# Patient Record
Sex: Male | Born: 1980 | Hispanic: Yes | Marital: Married | Smoking: Current every day smoker
Health system: Southern US, Community
[De-identification: ages and names within clinical notes are randomized; demographics above are authoritative.]

---

## 2019-07-23 ENCOUNTER — Emergency Department (HOSPITAL_COMMUNITY)
Admission: EM | Admit: 2019-07-23 | Discharge: 2019-07-24 | Disposition: A | Discharge status: Home / Self Care | Payer: Self-pay | Attending: Emergency Medicine | Admitting: Emergency Medicine

## 2019-07-23 ENCOUNTER — Emergency Department (HOSPITAL_COMMUNITY): Payer: Self-pay

## 2019-07-23 ENCOUNTER — Encounter (HOSPITAL_COMMUNITY): Payer: Self-pay | Admitting: Emergency Medicine

## 2019-07-23 ENCOUNTER — Other Ambulatory Visit: Payer: Self-pay

## 2019-07-23 DIAGNOSIS — Y999 Unspecified external cause status: Secondary | ICD-10-CM | POA: Insufficient documentation

## 2019-07-23 DIAGNOSIS — Y929 Unspecified place or not applicable: Secondary | ICD-10-CM | POA: Insufficient documentation

## 2019-07-23 DIAGNOSIS — W311XXA Contact with metalworking machines, initial encounter: Secondary | ICD-10-CM | POA: Insufficient documentation

## 2019-07-23 DIAGNOSIS — S81811A Laceration without foreign body, right lower leg, initial encounter: Secondary | ICD-10-CM | POA: Insufficient documentation

## 2019-07-23 DIAGNOSIS — Y9389 Activity, other specified: Secondary | ICD-10-CM | POA: Insufficient documentation

## 2019-07-23 DIAGNOSIS — Z23 Encounter for immunization: Secondary | ICD-10-CM | POA: Insufficient documentation

## 2019-07-23 DIAGNOSIS — F17210 Nicotine dependence, cigarettes, uncomplicated: Secondary | ICD-10-CM | POA: Insufficient documentation

## 2019-07-23 MED ORDER — OXYCODONE-ACETAMINOPHEN 5-325 MG PO TABS
1.0000 | ORAL_TABLET | Freq: Once | ORAL | Status: AC
  Administered 2019-07-23: 22:00:00 1 via ORAL
  Filled 2019-07-23: qty 1

## 2019-07-23 NOTE — ED Triage Notes (Signed)
Patient accidentally hit his right upper shin with a metal grinder this evening , presents with laceration approx. 2" with mild bleeding at right upper shin , sterile gauze dressing applied at triage .

## 2019-07-24 MED ORDER — HYDROCODONE-ACETAMINOPHEN 5-325 MG PO TABS: 1.0000 | ORAL_TABLET | Freq: Four times a day (QID) | ORAL | 0 refills | Status: AC | PRN

## 2019-07-24 MED ORDER — TETANUS-DIPHTH-ACELL PERTUSSIS 5-2.5-18.5 LF-MCG/0.5 IM SUSP
0.5000 mL | Freq: Once | INTRAMUSCULAR | Status: AC
  Administered 2019-07-24: 01:00:00 0.5 mL via INTRAMUSCULAR
  Filled 2019-07-24: qty 0.5

## 2019-07-24 MED ORDER — LIDOCAINE-EPINEPHRINE (PF) 2 %-1:200000 IJ SOLN
10.0000 mL | Freq: Once | INTRAMUSCULAR | Status: AC
  Administered 2019-07-24: 10 mL
  Filled 2019-07-24: qty 20

## 2019-07-24 MED ORDER — BUPIVACAINE HCL (PF) 0.5 % IJ SOLN
10.0000 mL | Freq: Once | INTRAMUSCULAR | Status: AC
  Administered 2019-07-24: 10 mL
  Filled 2019-07-24: qty 10

## 2019-07-24 MED ORDER — DOXYCYCLINE HYCLATE 100 MG PO CAPS: 100.0000 mg | ORAL_CAPSULE | Freq: Two times a day (BID) | ORAL | 0 refills | Status: AC

## 2019-07-24 MED ORDER — IBUPROFEN 600 MG PO TABS: 600.0000 mg | ORAL_TABLET | Freq: Four times a day (QID) | ORAL | 0 refills | Status: AC | PRN

## 2019-07-24 NOTE — Discharge Instructions (Addendum)
°  Wound Care - Laceration Please take all of your antibiotics until finished!   You may develop abdominal discomfort or diarrhea from the antibiotic.  You may help offset this with probiotics which you can buy or get in yogurt. Do not eat or take the probiotics until 2 hours after your antibiotic.   Leave the bandage in place until you are seen by the orthopedic specialist.  When the orthopedic specialist says it is okay, clean the wound and surrounding area gently with tap water and mild soap. Rinse well and blot dry. Do not scrub the wound, as this may cause the wound edges to come apart. You may shower, but avoid submerging the wound, such as with a bath or swimming. Clean the wound daily to prevent infection. Do not use cleaners such as hydrogen peroxide or alcohol.   Scar reduction: Application of a topical antibiotic ointment, such as Neosporin, after the wound has begun to close and heal well can decrease scab formation and reduce scarring. After the wound has healed and wound closures have been removed, application of ointments such as Aquaphor can also reduce scar formation.  The key to scar reduction is keeping the skin well hydrated and supple. Drinking plenty of water throughout the day (At least eight 8oz glasses of water a day) is essential to staying well hydrated.  Sun exposure: Keep the wound out of the sun. After the wound has healed, continue to protect it from the sun by wearing protective clothing or applying sunscreen.  Pain: You may use Tylenol, naproxen, or ibuprofen for pain. Antiinflammatory medications: Take 600 mg of ibuprofen every 6 hours or 440 mg (over the counter dose) to 500 mg (prescription dose) of naproxen every 12 hours for the next 3 days. After this time, these medications may be used as needed for pain. Take these medications with food to avoid upset stomach. Choose only one of these medications, do not take them together. Acetaminophen (generic for Tylenol):  Should you continue to have additional pain while taking the ibuprofen or naproxen, you may add in acetaminophen as needed. Your daily total maximum amount of acetaminophen from all sources should be limited to 4000mg /day for persons without liver problems, or 2000mg /day for those with liver problems.  Vicodin: May take Vicodin (hydrocodone-acetaminophen) as needed for severe pain.   Do not drive or perform other dangerous activities while taking this medication as it can cause drowsiness as well as changes in reaction time and judgement.   Please note that each pill of Vicodin contains 325 mg of acetaminophen (generic for Tylenol) and the above dosage limits apply.  Follow-up: There was some mild appearing damage to the bone.  Follow-up with the orthopedic (bone) specialist Friday, October 16.  Call later today to make this appointment.  Suture/staple removal: Return to the ED in 8-12 days for suture removal.  Return to the ED sooner should the wound edges come apart or signs of infection arise, such as spreading redness, puffiness/swelling, pus draining from the wound, severe increase in pain, fever over 100.2F, or any other major issues.  For prescription assistance, may try using prescription discount sites or apps, such as goodrx.com

## 2019-07-24 NOTE — ED Notes (Signed)
Pt discharged from ED; instructions provided and scripts given; Pt encouraged to return to ED if symptoms worsen and to f/u with PCP; Pt verbalized understanding of all instructions 

## 2019-07-24 NOTE — ED Provider Notes (Signed)
[facility] EMERGENCY DEPARTMENT Provider Note   CSN: [id number] Arrival date & time: 07/23/19  2151     History   Chief Complaint Chief Complaint  Patient presents with   Leg Injury    Laceration     HPI Jonathan Horn is a 38 y.o. male.     HPI  Jonathan Horn is a 38 y.o. male, patient with no pertinent past medical history, presenting to the ED with right lower leg injury that occurred around 9 PM October 13. Patient was using a grinder to grind metal, slipped, and cut his right shin. Unknown last tetanus.  Pain is moderate, throbbing, nonradiating.  Denies numbness, weakness, other injuries.    History reviewed. No pertinent past medical history.  There are no active problems to display for this patient.   History reviewed. No pertinent surgical history.      Home Medications    Prior to Admission medications   Medication Sig Start Date End Date Taking? Authorizing Provider  doxycycline (VIBRAMYCIN) 100 MG capsule Take 1 capsule (100 mg total) by mouth 2 (two) times daily for 7 days. 07/24/19 07/31/19  Kaydance Bowie C, PA-C  HYDROcodone-acetaminophen (NORCO/VICODIN) 5-325 MG tablet Take 1-2 tablets by mouth every 6 (six) hours as needed for severe pain. 07/24/19   Shandria Clinch C, PA-C  ibuprofen (ADVIL) 600 MG tablet Take 1 tablet (600 mg total) by mouth every 6 (six) hours as needed. 07/24/19   Minetta Krisher, Hillard Danker, PA-C    Family History No family history on file.  Social History Social History   Tobacco Use   Smoking status: Current Every Day Smoker   Smokeless tobacco: Never Used  Substance Use Topics   Alcohol use: Yes   Drug use: Never     Allergies   Patient has no known allergies.   Review of Systems Review of Systems  Skin: Positive for wound.  Neurological: Negative for weakness and numbness.     Physical Exam Updated Vital Signs BP 127/79 (BP Location: Right Arm)    Pulse 66    Temp 97.7 F (36.5 C) (Oral)     Resp 16   Physical Exam Vitals signs and nursing note reviewed.  Constitutional:      General: He is not in acute distress.    Appearance: He is well-developed. He is not diaphoretic.  HENT:     Head: Normocephalic and atraumatic.  Eyes:     Conjunctiva/sclera: Conjunctivae normal.  Neck:     Musculoskeletal: Neck supple.  Cardiovascular:     Rate and Rhythm: Normal rate and regular rhythm.     Pulses:          Dorsalis pedis pulses are 2+ on the right side and 2+ on the left side.       Posterior tibial pulses are 2+ on the right side and 2+ on the left side.  Pulmonary:     Effort: Pulmonary effort is normal.  Musculoskeletal:     Comments: Approximately 4 cm slightly crescent-shaped laceration to the right anterior lower leg. Full range of motion intact in the right knee and ankle without noted difficulty.  Skin:    General: Skin is warm and dry.     Coloration: Skin is not pale.  Neurological:     Mental Status: He is alert.     Comments: Sensation to light touch grossly intact in the right lower extremity. Strength 5/5 with flexion and extension at the right knee and ankle. Limping  gait, but stable and does not require assistance.  Psychiatric:        Behavior: Behavior normal.          ED Treatments / Results  Labs (all labs ordered are listed, but only abnormal results are displayed) Labs Reviewed - No data to display  EKG None  Radiology Dg Tibia/fibula Right  Result Date: 07/23/2019 CLINICAL DATA:  Struck upper shin with metal grinder EXAM: RIGHT TIBIA AND FIBULA - 2 VIEW COMPARISON:  None. FINDINGS: Anterior swelling is noted about the proximal lower leg. There is soft tissue laceration, gas and few punctate radiodensities in the anterior soft tissues with a small cortical defect subjacent to the site of laceration. No fracture or traumatic malalignment is evident. Overlying bandaging material is present. IMPRESSION: Soft tissue laceration about the  proximal lower leg with a few punctate radiodensities in the anterior soft tissues which could reflect punctate bone fragments given cortical defect or radiodense debris. Electronically Signed   By: Kreg ShropshirePrice  DeHay M.D.   On: 07/23/2019 22:38    Procedures .Marland Kitchen.Laceration Repair  Date/Time: 07/24/2019 1:43 AM Performed by: Anselm PancoastJoy, Linton Stolp C, PA-C Authorized by: Anselm PancoastJoy, Anihya Tuma C, PA-C   Consent:    Consent obtained:  Verbal   Consent given by:  Patient   Risks discussed:  Infection, pain, retained foreign body, vascular damage, poor wound healing, nerve damage, need for additional repair and poor cosmetic result Anesthesia (see MAR for exact dosages):    Anesthesia method:  Local infiltration   Local anesthetic:  Lidocaine 2% WITH epi and bupivacaine 0.5% w/o epi Laceration details:    Location:  Leg   Leg location:  R lower leg   Length (cm):  4 Repair type:    Repair type:  Intermediate Exploration:    Wound exploration: wound explored through full range of motion   Treatment:    Area cleansed with:  Betadine and saline   Amount of cleaning:  Extensive   Irrigation solution:  Sterile saline   Irrigation volume:  1000 cc   Irrigation method:  Syringe Skin repair:    Repair method:  Sutures   Suture size:  3-0   Suture material:  Prolene   Suture technique:  Horizontal mattress   Number of sutures:  2 Approximation:    Approximation:  Loose Post-procedure details:    Dressing:  Non-adherent dressing, sterile dressing, bulky dressing and splint for protection   Patient tolerance of procedure:  Tolerated well, no immediate complications   (including critical care time)  Medications Ordered in ED Medications  oxyCODONE-acetaminophen (PERCOCET/ROXICET) 5-325 MG per tablet 1 tablet (1 tablet Oral Given 07/23/19 2216)  bupivacaine (MARCAINE) 0.5 % injection 10 mL (10 mLs Infiltration Given by Other 07/24/19 0101)  Tdap (BOOSTRIX) injection 0.5 mL (0.5 mLs Intramuscular Given 07/24/19 0101)   lidocaine-EPINEPHrine (XYLOCAINE W/EPI) 2 %-1:200000 (PF) injection 10 mL (10 mLs Infiltration Given by Other 07/24/19 0101)     Initial Impression / Assessment and Plan / ED Course  I have reviewed the triage vital signs and the nursing notes.  Pertinent labs & imaging results that were available during my care of the patient were reviewed by me and considered in my medical decision making (see chart for details).  Clinical Course as of Jul 23 442  Wed Jul 24, 2019  0256 Spoke with Dr. August Saucerean, orthopedic surgeon.  Recommends loose closure with two sutures. Weightbearing with knee immobilizer. Large volume irrigation.Start on antibiotic. Follow-up in office Friday, October 16.   [  SJ]    Clinical Course User Index [SJ] Swain Acree C, PA-C       Patient presents with laceration to the right lower leg.  On x-ray, there appears to be some injury to the tibia.  No evidence of neurovascular compromise on exam. Wound loosely repaired without immediate complication.  Orthopedic follow-up. The patient was given instructions for home care as well as return precautions. Patient voices understanding of these instructions, accepts the plan, and is comfortable with discharge.  Final Clinical Impressions(s) / ED Diagnoses   Final diagnoses:  Laceration of right lower extremity, initial encounter    ED Discharge Orders         Ordered    doxycycline (VIBRAMYCIN) 100 MG capsule  2 times daily     07/24/19 0251    ibuprofen (ADVIL) 600 MG tablet  Every 6 hours PRN     07/24/19 0251    HYDROcodone-acetaminophen (NORCO/VICODIN) 5-325 MG tablet  Every 6 hours PRN     07/24/19 0251           Lorayne Bender, PA-C 07/24/19 0444    Orpah Greek, MD 07/24/19 [id number]

## 2019-07-24 NOTE — Progress Notes (Signed)
Grinder wheel injury to right tibia X-rays reviewed.  Bone fragments and possible ceramic fragments embedded in the tissue. Plan at this time is 2 L irrigation plus loose closure to allow this to drain.  Knee immobilizer and follow-up in clinic on Monday.  2 days of oral antibiotics indicated.  He may need operative surgical washout next week but for now we will start with this.

## 2020-02-06 ENCOUNTER — Encounter (HOSPITAL_COMMUNITY): Payer: Self-pay | Admitting: Emergency Medicine

## 2020-02-06 ENCOUNTER — Other Ambulatory Visit: Payer: Self-pay

## 2020-02-06 ENCOUNTER — Emergency Department (HOSPITAL_COMMUNITY)
Admission: EM | Admit: 2020-02-06 | Discharge: 2020-02-06 | Disposition: A | Discharge status: Home / Self Care | Payer: Self-pay | Attending: Emergency Medicine | Admitting: Emergency Medicine

## 2020-02-06 ENCOUNTER — Emergency Department (HOSPITAL_COMMUNITY): Payer: Self-pay

## 2020-02-06 DIAGNOSIS — F17210 Nicotine dependence, cigarettes, uncomplicated: Secondary | ICD-10-CM | POA: Insufficient documentation

## 2020-02-06 DIAGNOSIS — J302 Other seasonal allergic rhinitis: Secondary | ICD-10-CM | POA: Insufficient documentation

## 2020-02-06 MED ORDER — CETIRIZINE HCL 10 MG PO TABS: 10.0000 mg | ORAL_TABLET | Freq: Every day | ORAL | 0 refills | Status: AC

## 2020-02-06 MED ORDER — CROMOLYN SODIUM 4 % OP SOLN: 1.0000 [drp] | Freq: Four times a day (QID) | OPHTHALMIC | 0 refills | Status: AC

## 2020-02-06 MED ORDER — ALBUTEROL SULFATE HFA 108 (90 BASE) MCG/ACT IN AERS
1.0000 | INHALATION_SPRAY | Freq: Once | RESPIRATORY_TRACT | Status: AC
  Administered 2020-02-06: 13:00:00 2 via RESPIRATORY_TRACT
  Filled 2020-02-06: qty 6.7

## 2020-02-06 MED ORDER — BENZONATATE 100 MG PO CAPS: 100.0000 mg | ORAL_CAPSULE | Freq: Three times a day (TID) | ORAL | 0 refills | Status: AC | PRN

## 2020-02-06 NOTE — Discharge Instructions (Signed)
Lo han visto hoy por alergias. Lea y siga todas las instrucciones proporcionadas. Regrese a la sala de emergencias si su condicin empeora o si presenta nuevos sntomas preocupantes. 1. Medicamentos: Se ha enviado una receta a su farmacia para 3 medicamentos. 1. Gotas para los ojos Opticrom: este medicamento se [place] para ayudar con las [place]. Este medicamento tarda de das a Patent attorney a funcionar. Utilice un diario. 2. Zyrtec: este es un medicamento para la Programmer, multimedia. Tmelo a diario. Puede comprar este medicamento sin receta, por lo que cuando se le acabe la receta, cmprelo en la tienda. 3. Tessalon: es un medicamento para la tos. Puede tomarlo cada 8 horas segn sea necesario. -Te dieron un inhalador de albuterol en el departamento de emergencias. Puede usar 1 inhalacin cada 6 horas si tiene sibilancias. Esto se [place] para ayudar al asma. Continuar con los medicamentos caseros [employer] segn lo prescrito. Revise todos los medicamentos y tmelos solo si no es alrgico a ellos. 2. Tratamiento: descansar, beber muchos lquidos. 3. Seguimiento: haga un seguimiento con el proveedor de atencin primaria programando una cita lo antes posible para una visita. Si no tiene un mdico de atencin [place], comunquese con [facility] al [phone] para obtener una referencia. Tambin existe la posibilidad de que tenga una reaccin alrgica a cualquiera de los medicamentos que le han recetado: todo el mundo reacciona de [place] diferente a los medicamentos y, aunque la MAYORA de las personas no tienen problemas con la mayora de los [place], es posible que tenga una reaccin como nuseas, vmitos, sarpullido. , hinchazn, dificultad para respirar. Si este es el caso, deje de tomar el medicamento inmediatamente y comunquese con su mdico. _______________________________  Bonita Quin have been seen today for allergies. Please read and follow all provided instructions. Return to the emergency  room for worsening condition or new concerning symptoms.    1. Medications:  Prescription has been sent to your pharmacy for 3 medications. 1.  Opticrom eyedrops-this medicine is used to help with allergies.  This medication takes days to weeks to start working.  Please use a daily. 2.  Zyrtec-this is an allergy medicine please take it daily.  You can buy this medication over-the-counter so when you run out of the prescription please buy it at the store 3.  Tessalon- this is a cough medicine.  You can take it every 8 hours as needed.  -You were given an albuterol inhaler in the emergency department.  You can use 1 puff every 6 hours if you are wheezing.  This is used to help asthma.   Continue usual home medications Take medications as prescribed. Please review all of the medicines and only take them if you do not have an allergy to them.   2. Treatment: rest, drink plenty of fluids  3. Follow Up:  Please follow up with primary care provider by scheduling an appointment as soon as possible for a visit  If you do not have a primary care physician, contact [facility] at [phone] for referral   It is also a possibility that you have an allergic reaction to any of the medicines that you have been prescribed - Everybody reacts differently to medications and while MOST people have no trouble with most medicines, you may have a reaction such as nausea, vomiting, rash, swelling, shortness of breath. If this is the case, please stop taking the medicine immediately and contact your physician.     ?

## 2020-02-06 NOTE — ED Triage Notes (Signed)
Pt endorses 5 days of nasal congestion. Pt took OTC allergy meds with no relief. Hx of asthma and using his inhaler.

## 2020-02-06 NOTE — ED Provider Notes (Signed)
[facility] EMERGENCY DEPARTMENT Provider Note   CSN: [id number] Arrival date & time: 02/06/20  0941     History Chief Complaint  Patient presents with  . Nasal Congestion    Jonathan Horn is a 39 y.o. male with self-reported history of asthma presents to emergency department today with chief complaint of progressively worsening nasal congestion x5 days.  Patient states he has had seasonal allergies in the past but they seem to be worse this year.  He is endorsing nasal congestion, nonproductive cough, itchy eyes, wheezing, and shortness of breath.  He borrowed inhaler from a friend without much symptom relief.  He has tried over-the-counter cough medicine.  He also states he got his first Covid vaccine yesterday and is unsure if his symptoms are related to that.  Patient denies any sick contacts.  He denies fever, chills, rash, headache, neck pain, visual changes, foreign body sensation in the eyes, eye injury, sore throat, sinus pressure, chest pain, abdominal pain, urinary symptoms, diarrhea, loss of sense of taste or smell.  Patient does not wear contacts.  Due to language barrier, a video interpreter was present during the history-taking and subsequent discussion (and for part of the physical exam) with this patient.     History reviewed. No pertinent past medical history.  There are no problems to display for this patient.   History reviewed. No pertinent surgical history.     No family history on file.  Social History   Tobacco Use  . Smoking status: Current Every Day Smoker  . Smokeless tobacco: Never Used  Substance Use Topics  . Alcohol use: Yes  . Drug use: Never    Home Medications Prior to Admission medications   Medication Sig Start Date End Date Taking? Authorizing Provider  HYDROcodone-acetaminophen (NORCO/VICODIN) 5-325 MG tablet Take 1-2 tablets by mouth every 6 (six) hours as needed for severe pain. 07/24/19   Joy, Shawn C, PA-C    ibuprofen (ADVIL) 600 MG tablet Take 1 tablet (600 mg total) by mouth every 6 (six) hours as needed. 07/24/19   Joy, Shawn C, PA-C    Allergies    Patient has no known allergies.  Review of Systems   Review of Systems  All other systems are reviewed and are negative for acute change except as noted in the HPI.   Physical Exam Updated Vital Signs BP (!) 138/91 (BP Location: Right Arm)   Pulse 83   Temp 98.6 F (37 C) (Oral)   Resp 18   SpO2 97%   Physical Exam Vitals and nursing note reviewed.  Constitutional:      Appearance: He is well-developed. He is not ill-appearing or toxic-appearing.  HENT:     Head: Normocephalic and atraumatic.     Nose: Congestion present.     Mouth/Throat:     Mouth: Mucous membranes are moist.     Pharynx: Uvula midline. No oropharyngeal exudate, posterior oropharyngeal erythema or uvula swelling.     Tonsils: No tonsillar exudate or tonsillar abscesses.  Eyes:     General: Lids are normal. Vision grossly intact. No allergic shiner or scleral icterus.       Right eye: No discharge.        Left eye: No discharge.     Extraocular Movements: Extraocular movements intact.     Conjunctiva/sclera: Conjunctivae normal.     Right eye: Right conjunctiva is not injected. No chemosis or exudate.    Left eye: Left conjunctiva is not injected. No  chemosis or exudate.    Pupils: Pupils are equal, round, and reactive to light.  Neck:     Vascular: No JVD.     Comments: No meningeal signs Cardiovascular:     Rate and Rhythm: Normal rate and regular rhythm.     Pulses: Normal pulses.     Heart sounds: Normal heart sounds.  Pulmonary:     Effort: Pulmonary effort is normal.     Breath sounds: Normal breath sounds.     Comments: Normal work of breathing.  Lungs are clear to auscultation in all fields.  No wheezing, rales, rhonchi.  Oxygen saturation is 97% on room air. Abdominal:     General: There is no distension.  Musculoskeletal:        General:  Normal range of motion.     Cervical back: Normal range of motion.  Skin:    General: Skin is warm and dry.     Capillary Refill: Capillary refill takes less than 2 seconds.     Findings: No rash.  Neurological:     Mental Status: He is oriented to person, place, and time.     GCS: GCS eye subscore is 4. GCS verbal subscore is 5. GCS motor subscore is 6.     Comments: Fluent speech, no facial droop.  Psychiatric:        Behavior: Behavior normal.     ED Results / Procedures / Treatments   Labs (all labs ordered are listed, but only abnormal results are displayed) Labs Reviewed - No data to display  EKG None  Radiology DG Chest 2 View  Result Date: 02/06/2020 CLINICAL DATA:  Nasal congestion.  History of asthma EXAM: CHEST - 2 VIEW COMPARISON:  None. FINDINGS: Lungs are clear. Heart size and pulmonary vascularity are normal. No adenopathy. No bone lesions. IMPRESSION: Lungs clear.  Cardiac silhouette within normal limits. Electronically Signed   By: Lowella Grip III M.D.   On: 02/06/2020 10:20    Procedures Procedures (including critical care time)  Medications Ordered in ED Medications - No data to display  ED Course  I have reviewed the triage vital signs and the nursing notes.  Pertinent labs & imaging results that were available during my care of the patient were reviewed by me and considered in my medical decision making (see chart for details).    MDM Rules/Calculators/A&P                      Patient seen and examined. Patient presents awake, alert, hemodynamically stable, afebrile, non toxic.  No tachycardia or hypoxia.  He is well-appearing.  He has normal work of breathing.  No wheezing heard.  He has no sinus tenderness on exam.  There is no conjunctival injection, no chemosis or exudates seen.  No signs of strep or peritonsillar abscess.  No meningeal signs.  No rash.  He denies any recent Covid exposure and politely declines Covid testing today.  He  received his first Covid vaccine yesterday.  Patient had a chest x-ray ordered in triage.  I viewed image which shows no acute infectious process.  Given his reassuring vital signs and exam feel that he can be discharged home with symptomatic care.  Will give albuterol inhaler here in the ED as he said he has been wheezing at home although no wheezing was heard today.  The patient appears reasonably screened and/or stabilized for discharge and I doubt any other medical condition or other [facility] requiring further  screening, evaluation, or treatment in the ED at this time prior to discharge. The patient is safe for discharge with strict return precautions discussed. Recommend pcp follow up if symptoms persist.  Jonathan Horn was evaluated in Emergency Department on 02/06/2020 for the symptoms described in the history of present illness. He was evaluated in the context of the global COVID-19 pandemic, which necessitated consideration that the patient might be at risk for infection with the SARS-CoV-2 virus that causes COVID-19. Institutional protocols and algorithms that pertain to the evaluation of patients at risk for COVID-19 are in a state of rapid change based on information released by regulatory bodies including the CDC and federal and state organizations. These policies and algorithms were followed during the patient's care in the ED.   Portions of this note were generated with Scientist, clinical (histocompatibility and immunogenetics). Dictation errors may occur despite best attempts at proofreading.    Final Clinical Impression(s) / ED Diagnoses Final diagnoses:  None    Rx / DC Orders ED Discharge Orders    None       Kathyrn Lass 02/06/20 1324    Tilden Fossa, MD 02/07/20 [phone]

## 2020-06-14 IMAGING — DX DG TIBIA/FIBULA 2V*R*
2 series · 2 of 2 positions shown · non-contrast
Comparison: None.

CLINICAL DATA: Struck upper shin with metal grinder

EXAM:
RIGHT TIBIA AND FIBULA - 2 VIEW

[tibia ap]
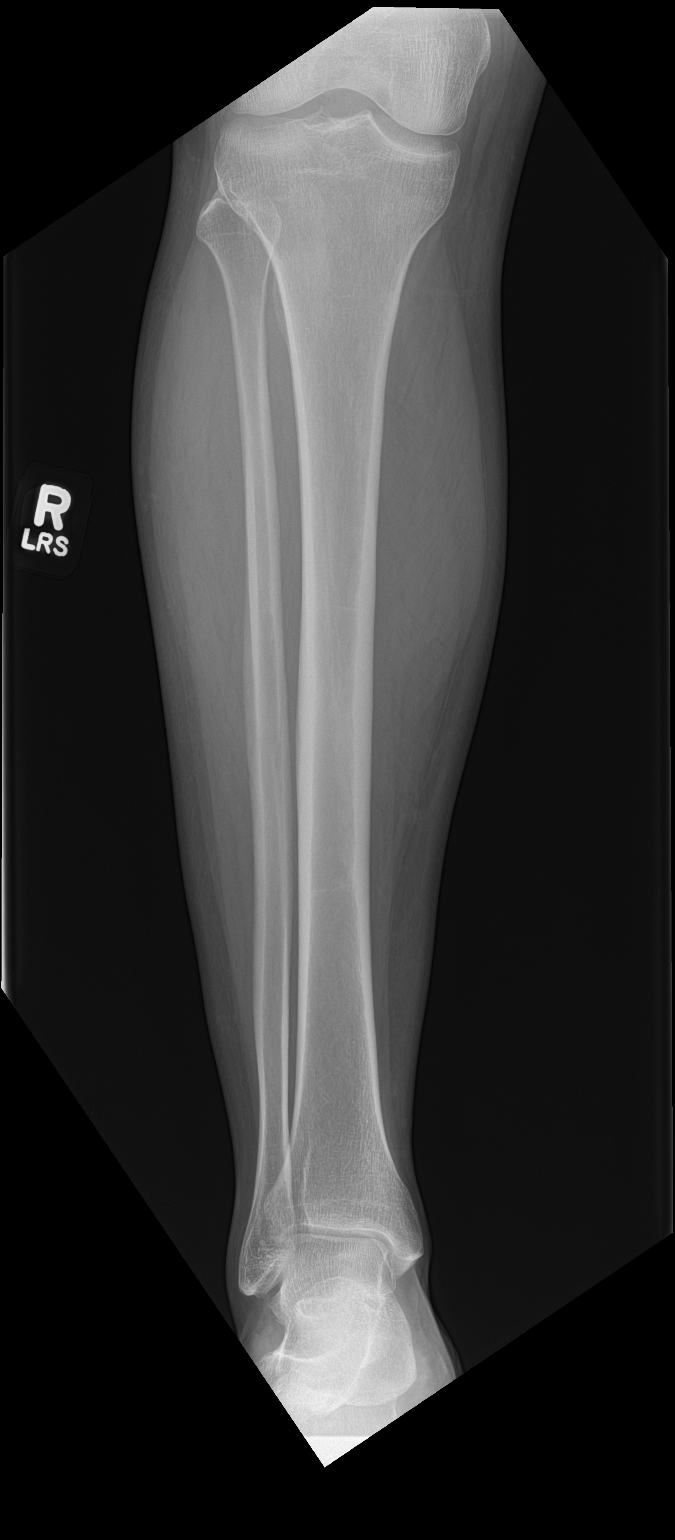

[tibia lat]
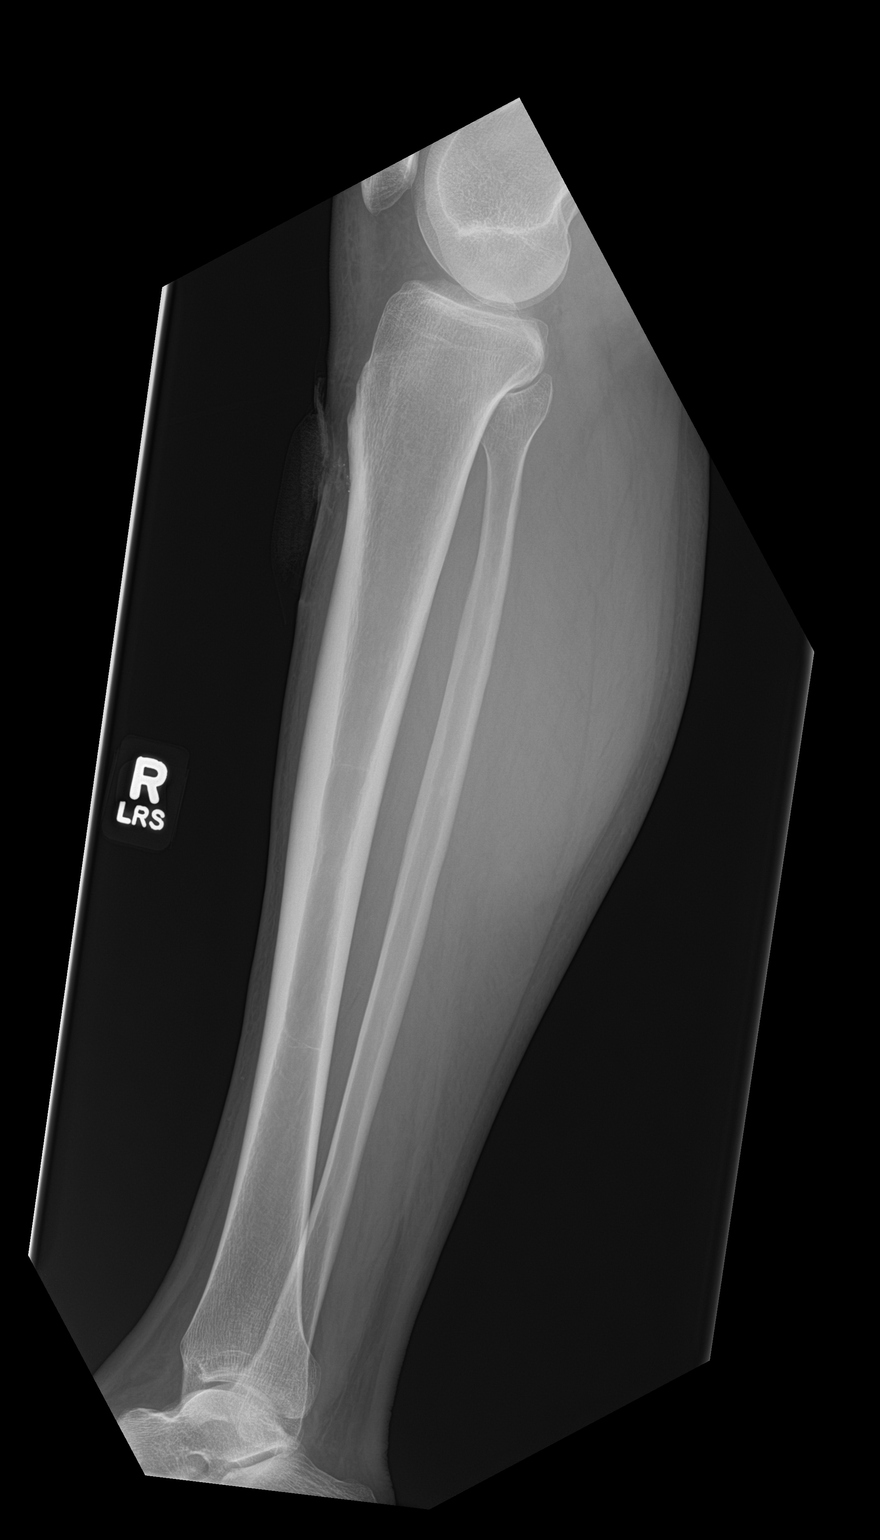

[2 of 2 positions shown; findings below may reference images not displayed]

FINDINGS: Anterior swelling is noted about the proximal lower leg. There is
soft tissue laceration, gas and few punctate radiodensities in the
anterior soft tissues with a small cortical defect subjacent to the
site of laceration. No fracture or traumatic malalignment is
evident. Overlying bandaging material is present.
IMPRESSION: Soft tissue laceration about the proximal lower leg with a few
punctate radiodensities in the anterior soft tissues which could
reflect punctate bone fragments given cortical defect or radiodense
debris.
